# Patient Record
Sex: Female | Born: 1985 | Race: Black or African American | Hispanic: No | Marital: Single | Smoking: Current every day smoker
Health system: Southern US, Community
[De-identification: ages and names within clinical notes are randomized; demographics above are authoritative.]

## PROBLEM LIST (undated history)

## (undated) ENCOUNTER — Inpatient Hospital Stay (HOSPITAL_COMMUNITY): Payer: Self-pay

## (undated) DIAGNOSIS — R0600 Dyspnea, unspecified: Secondary | ICD-10-CM

## (undated) DIAGNOSIS — J45909 Unspecified asthma, uncomplicated: Secondary | ICD-10-CM

## (undated) DIAGNOSIS — D219 Benign neoplasm of connective and other soft tissue, unspecified: Secondary | ICD-10-CM

## (undated) DIAGNOSIS — F32A Depression, unspecified: Secondary | ICD-10-CM

## (undated) DIAGNOSIS — F329 Major depressive disorder, single episode, unspecified: Secondary | ICD-10-CM

---

## 2017-02-24 ENCOUNTER — Inpatient Hospital Stay (HOSPITAL_COMMUNITY)
Admission: AD | Admit: 2017-02-24 | Discharge: 2017-02-24 | Disposition: A | Discharge status: Home / Self Care | Payer: Self-pay | Source: Ambulatory Visit | Attending: Obstetrics & Gynecology | Admitting: Obstetrics & Gynecology

## 2017-02-24 ENCOUNTER — Encounter (HOSPITAL_COMMUNITY): Payer: Self-pay

## 2017-02-24 ENCOUNTER — Inpatient Hospital Stay (HOSPITAL_COMMUNITY): Payer: Self-pay

## 2017-02-24 DIAGNOSIS — D259 Leiomyoma of uterus, unspecified: Secondary | ICD-10-CM | POA: Insufficient documentation

## 2017-02-24 DIAGNOSIS — R109 Unspecified abdominal pain: Secondary | ICD-10-CM

## 2017-02-24 DIAGNOSIS — Z3A01 Less than 8 weeks gestation of pregnancy: Secondary | ICD-10-CM | POA: Insufficient documentation

## 2017-02-24 DIAGNOSIS — F17200 Nicotine dependence, unspecified, uncomplicated: Secondary | ICD-10-CM | POA: Insufficient documentation

## 2017-02-24 DIAGNOSIS — D251 Intramural leiomyoma of uterus: Secondary | ICD-10-CM

## 2017-02-24 DIAGNOSIS — O3411 Maternal care for benign tumor of corpus uteri, first trimester: Secondary | ICD-10-CM | POA: Insufficient documentation

## 2017-02-24 DIAGNOSIS — O26891 Other specified pregnancy related conditions, first trimester: Secondary | ICD-10-CM

## 2017-02-24 DIAGNOSIS — D252 Subserosal leiomyoma of uterus: Secondary | ICD-10-CM

## 2017-02-24 DIAGNOSIS — D250 Submucous leiomyoma of uterus: Secondary | ICD-10-CM

## 2017-02-24 HISTORY — DX: Dyspnea, unspecified: R06.00

## 2017-02-24 HISTORY — DX: Depression, unspecified: F32.A

## 2017-02-24 HISTORY — DX: Unspecified asthma, uncomplicated: J45.909

## 2017-02-24 HISTORY — DX: Major depressive disorder, single episode, unspecified: F32.9

## 2017-02-24 HISTORY — DX: Benign neoplasm of connective and other soft tissue, unspecified: D21.9

## 2017-02-24 LAB — URINALYSIS, ROUTINE W REFLEX MICROSCOPIC
BILIRUBIN URINE: NEGATIVE
Glucose, UA: NEGATIVE mg/dL
Hgb urine dipstick: NEGATIVE
KETONES UR: NEGATIVE mg/dL
Leukocytes, UA: NEGATIVE
NITRITE: NEGATIVE
Protein, ur: NEGATIVE mg/dL
Specific Gravity, Urine: 1.026 (ref 1.005–1.030)
pH: 6 (ref 5.0–8.0)

## 2017-02-24 LAB — HCG, QUANTITATIVE, PREGNANCY: HCG, BETA CHAIN, QUANT, S: 9815 m[IU]/mL — AB (ref <5)

## 2017-02-24 LAB — POCT PREGNANCY, URINE: PREG TEST UR: POSITIVE — AB

## 2017-02-24 NOTE — Progress Notes (Signed)
Spoke with Dr. Harolyn Rutherford she recommends pt have OP u/s in one week and cancel repeat quant. Spoke with pt on phone and informed her of plan for repeat u/s in one week. She is aware that she is not to come for her quant Monday.

## 2017-02-24 NOTE — MAU Note (Signed)
Patient has irregular periods, is concerned because she has been partying, positive pregnancy test last Sunday, has a fibroid, was told would have a miscarriage because of the fibroid, wants to know that everything is okay, patient does a little bit a pain on right side.

## 2017-02-24 NOTE — MAU Provider Note (Signed)
History   [id number] unsure of dates due to irreg menses, States has large fibroid that was diagnosised in [place]. States was told she could not carry a pregnancy. Admits to smoking and drinking. Pt was counseled.  CSN: [id number]  Arrival date & time 02/24/17  1400   None     Chief Complaint  Patient presents with  . Possible Pregnancy  . Fibroids    HPI  Past Medical History:  Diagnosis Date  . Asthma   . Depression   . Dyspnea   . Fibroid     History reviewed. No pertinent surgical history.  Family History  Problem Relation Age of Onset  . Arthritis Mother   . Cancer Maternal Aunt     Social History  Substance Use Topics  . Smoking status: Current Every Day Smoker  . Smokeless tobacco: Never Used  . Alcohol use Yes    OB History    Gravida Para Term Preterm AB Living   1             SAB TAB Ectopic Multiple Live Births                  Review of Systems  Constitutional: Negative.   HENT: Negative.   Eyes: Negative.   Respiratory: Negative.   Cardiovascular: Negative.   Gastrointestinal: Positive for abdominal pain.  Endocrine: Negative.   Genitourinary: Negative.   Musculoskeletal: Negative.   Skin: Negative.     Allergies  Coconut oil; Fish allergy; Peanut-containing drug products; Shellfish allergy; Lactose intolerance (gi); Wheat bran; and Soy allergy  Home Medications    BP 127/74 (BP Location: Right Arm)   Pulse 89   Temp 99.6 F (37.6 C) (Oral)   Resp 18   Ht 5\' 3"  (1.6 m)   Wt [id number] lb (72.6 kg)   LMP  (LMP Unknown)   BMI 28.34 kg/m   Physical Exam  Constitutional: She is oriented to person, place, and time. She appears well-developed and well-nourished.  HENT:  Head: Normocephalic.  Eyes: Pupils are equal, round, and reactive to light.  Neck: Normal range of motion.  Cardiovascular: Normal rate, regular rhythm, normal heart sounds and intact distal pulses.   Pulmonary/Chest: Effort normal and breath sounds normal.  Abdominal: Soft.  Bowel sounds are normal.  Genitourinary: Vagina normal and uterus normal.  Musculoskeletal: Normal range of motion.  Neurological: She is alert and oriented to person, place, and time. She has normal reflexes.  Skin: Skin is warm and dry.  Psychiatric: She has a normal mood and affect. Her behavior is normal. Judgment and thought content normal.    MAU Course  Procedures (including critical care time)  Labs Reviewed  URINALYSIS, ROUTINE W REFLEX MICROSCOPIC - Abnormal; Notable for the following:       Result Value   APPearance HAZY (*)    All other components within normal limits  HCG, QUANTITATIVE, PREGNANCY   No results found.   No diagnosis found.    MDM  VSS, Quant, will get [place] to assess pregnancy and fibroid. US show IUP at 5.5 wks and large uterine fibroid. Quant 9815. Will repeat scan in one week  Daiva Nakayama, CNM   Attestation of Attending Supervision of Advanced Practice Provider (PA/CNM/NP): Evaluation and management procedures were performed by the Advanced Practice Provider under my supervision and collaboration.  I have reviewed the Advanced Practice Provider's note and chart, and I agree with the management and plan.  Verita Schneiders, MD, [place] Attending  [place], [place]

## 2017-02-24 NOTE — Discharge Instructions (Signed)
Abdominal Pain During Pregnancy °Belly (abdominal) pain is common during pregnancy. Most of the time, it is not a serious problem. Other times, it can be a sign that something is wrong with the pregnancy. Always tell your doctor if you have belly pain. °Follow these instructions at home: °Monitor your belly pain for any changes. The following actions may help you feel better: °· Do not have sex (intercourse) or put anything in your vagina until you feel better. °· Rest until your pain stops. °· Drink clear fluids if you feel sick to your stomach (nauseous). Do not eat solid food until you feel better. °· Only take medicine as told by your doctor. °· Keep all doctor visits as told. °Get help right away if: °· You are bleeding, leaking fluid, or pieces of tissue come out of your vagina. °· You have more pain or cramping. °· You keep throwing up (vomiting). °· You have pain when you pee (urinate) or have blood in your pee. °· You have a fever. °· You do not feel your baby moving as much. °· You feel very weak or feel like passing out. °· You have trouble breathing, with or without belly pain. °· You have a very bad headache and belly pain. °· You have fluid leaking from your vagina and belly pain. °· You keep having watery poop (diarrhea). °· Your belly pain does not go away after resting, or the pain gets worse. °This information is not intended to replace advice given to you by your health care provider. Make sure you discuss any questions you have with your health care provider. °Document Released: 11/29/2009 Document Revised: 07/19/2016 Document Reviewed: 07/10/2013 °Elsevier Interactive Patient Education © 2017 [employer] ° °

## 2017-03-05 ENCOUNTER — Telehealth: Payer: Self-pay | Admitting: *Deleted

## 2017-03-05 DIAGNOSIS — Z3490 Encounter for supervision of normal pregnancy, unspecified, unspecified trimester: Secondary | ICD-10-CM

## 2017-03-06 NOTE — Telephone Encounter (Signed)
Ultrasound ordered

## 2017-03-09 ENCOUNTER — Ambulatory Visit (HOSPITAL_COMMUNITY)
Admission: RE | Admit: 2017-03-09 | Discharge: 2017-03-09 | Disposition: A | Discharge status: Home / Self Care | Payer: Self-pay | Source: Ambulatory Visit | Attending: Family Medicine | Admitting: Family Medicine

## 2017-03-09 DIAGNOSIS — D259 Leiomyoma of uterus, unspecified: Secondary | ICD-10-CM | POA: Insufficient documentation

## 2017-03-09 DIAGNOSIS — Z3490 Encounter for supervision of normal pregnancy, unspecified, unspecified trimester: Secondary | ICD-10-CM | POA: Insufficient documentation

## 2018-01-04 IMAGING — US US OB TRANSVAGINAL
1 series · 15 of 28 positions shown · non-contrast
Comparison: Pelvic ultrasound 02/24/2017

CLINICAL DATA: Pregnant patient for follow-up evaluation.

EXAM:
TRANSVAGINAL OB ULTRASOUND
TECHNIQUE: Transvaginal ultrasound was performed for complete evaluation of the
gestation as well as the maternal uterus, adnexal regions, and
pelvic cul-de-sac.

[Series 1: us ob transvaginal · 56 acquisitions, 15 frames shown]
[im 1/56]
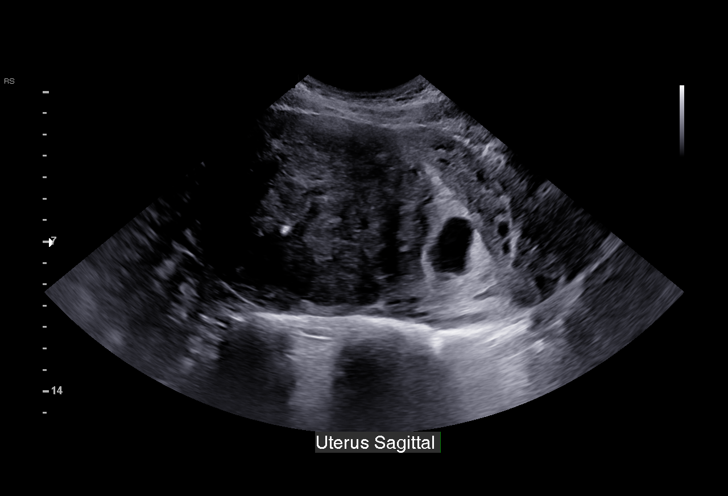
[im 5/56]
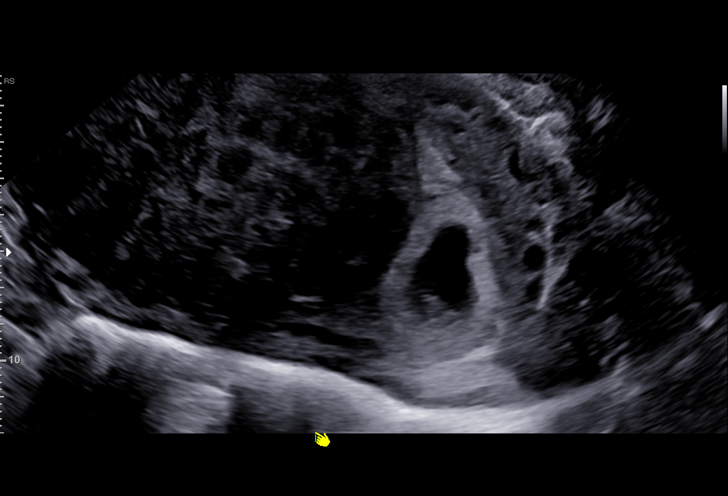
[im 9/56]
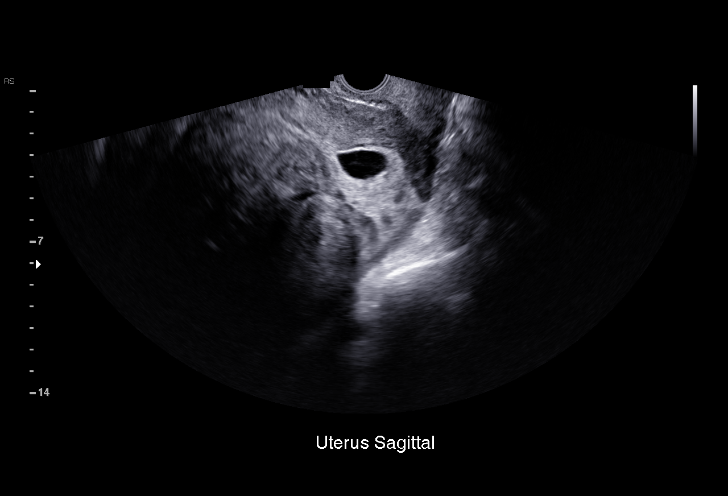
[im 13/56]
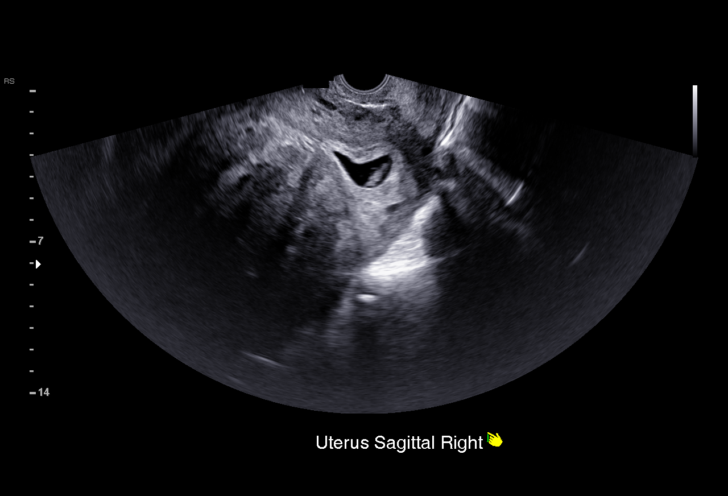
[im 17/56]
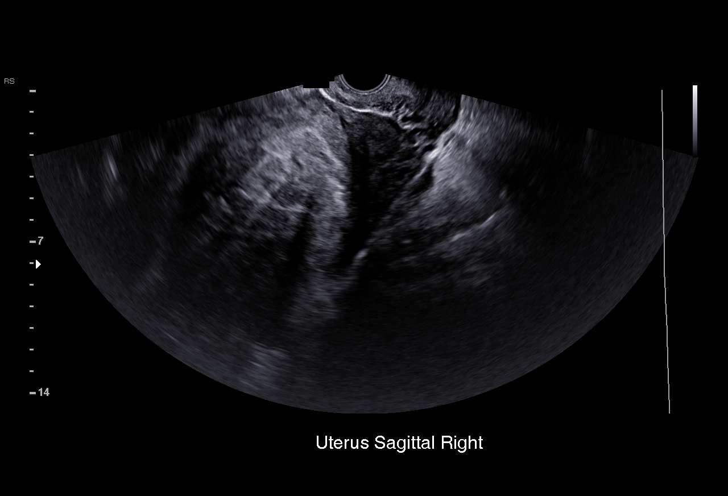
[im 21/56]
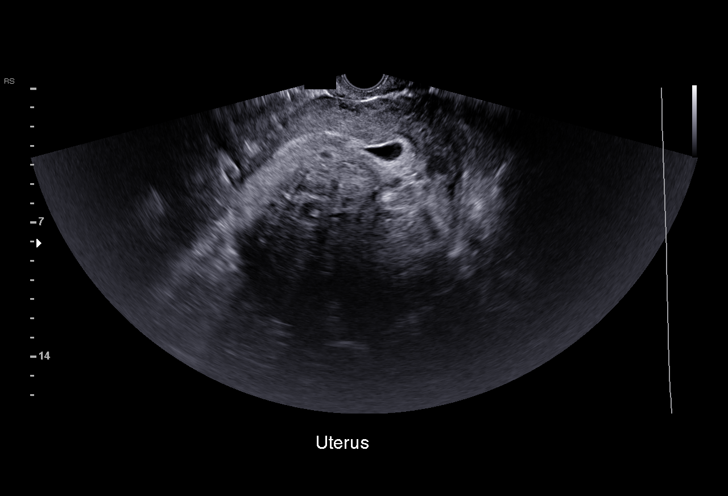
[im 25/56]
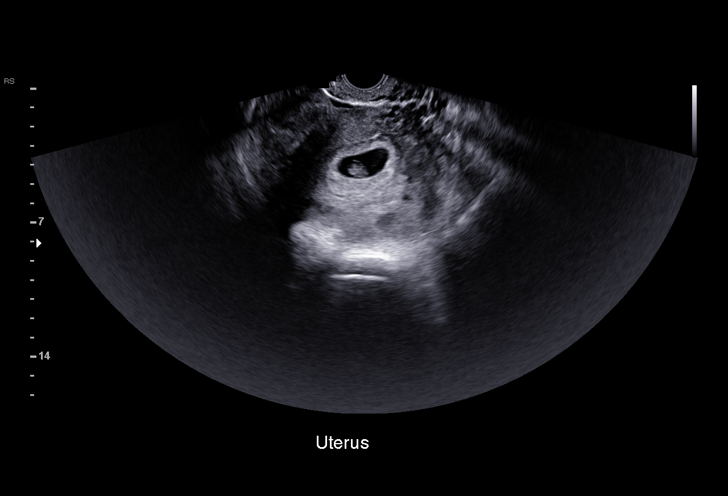
[im 29/56]
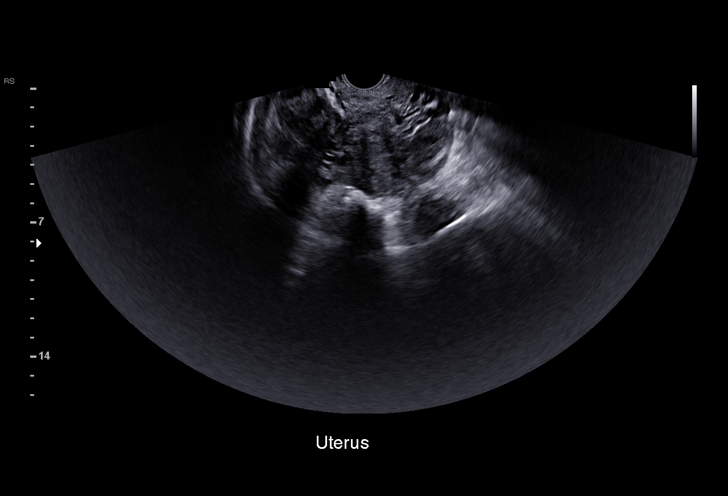
[im 31/56]
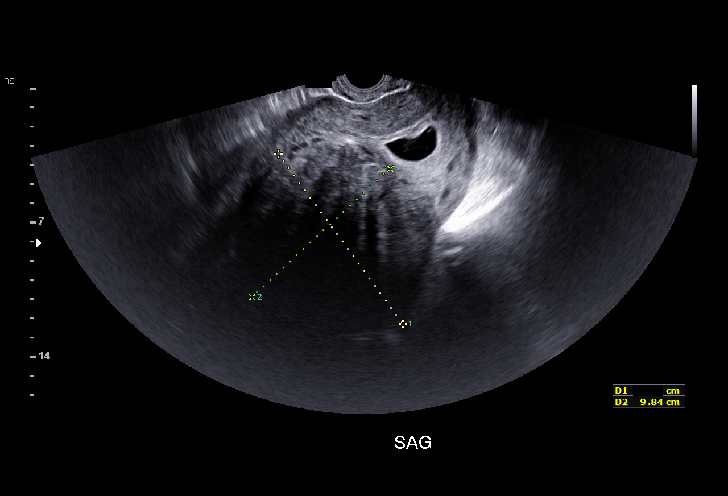
[im 35/56]
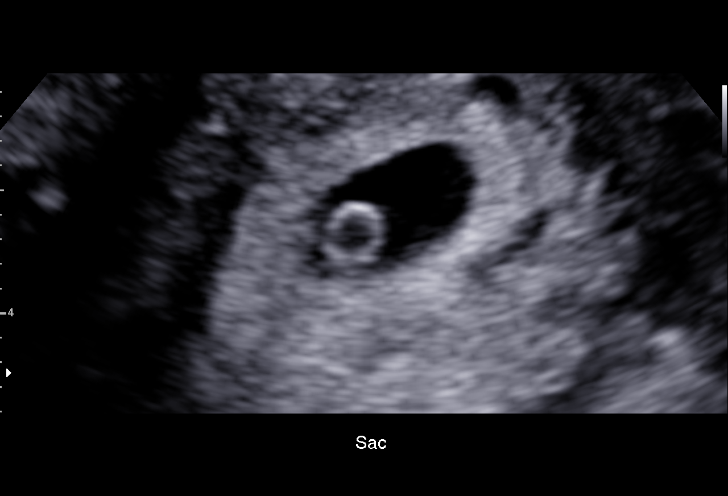
[im 39/56]
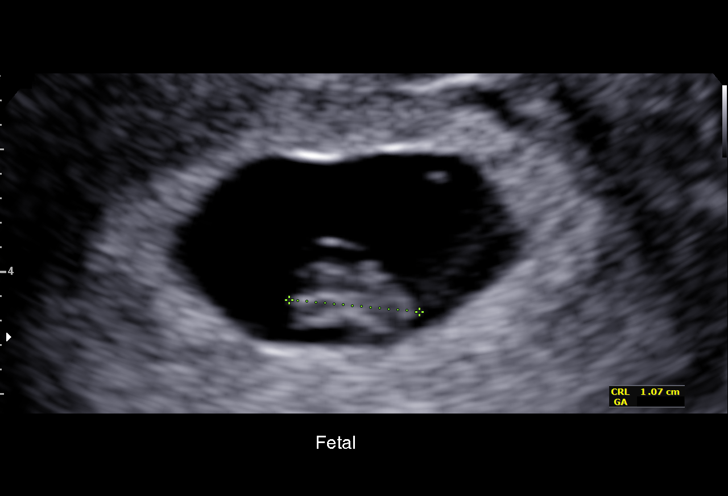
[im 43/56]
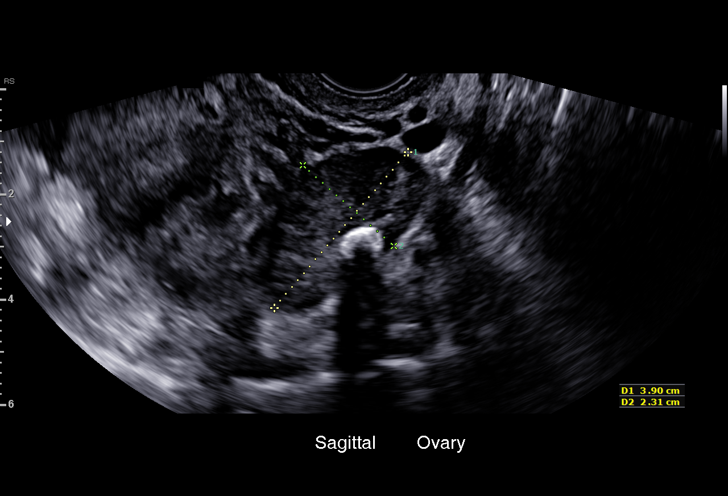
[im 47/56]
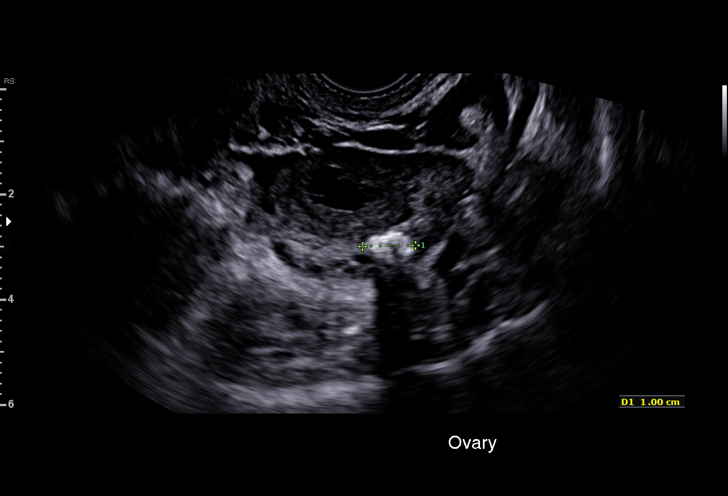
[im 51/56]
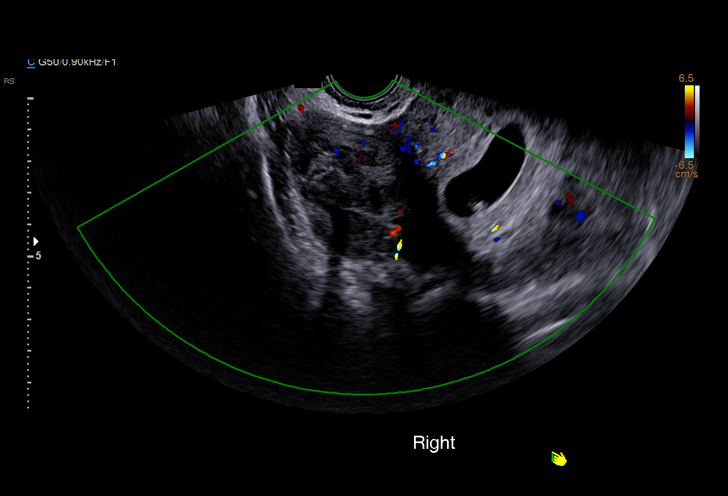
[im 56/56]
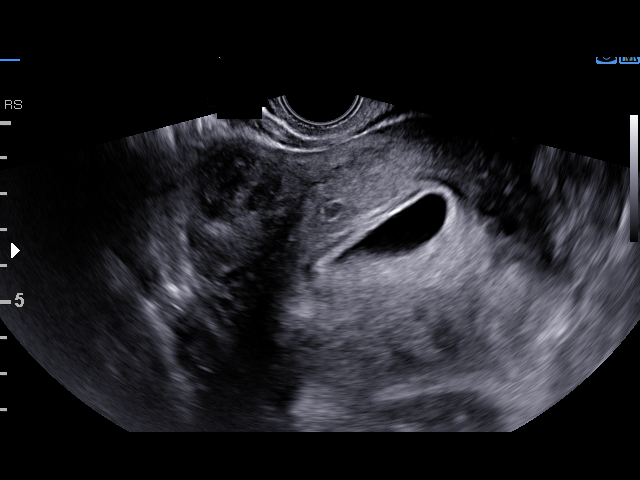

[15 of 28 positions shown; findings below may reference images not displayed]

FINDINGS: Intrauterine gestational sac: Single

Yolk sac:  Visualized.

Embryo:  Visualized.

Cardiac Activity: Visualized.

Heart Rate: 140 bpm

CRL:   10.7  mm   7 w 1 d                  US EDC: 10/25/2017

Subchorionic hemorrhage:  None visualized.

Maternal uterus/adnexae: Large fundal fibroid measuring up to 11 cm.
Re- demonstrated echogenic mass within the left ovary, potentially
representing a small dermoid. Right ovary is not well visualized.
Additionally, there is suggestion of a smaller 4 cm fibroid within
the right aspect of the uterine fundus.
IMPRESSION: Single live intrauterine gestation.  No subchorionic hemorrhage.

Fibroid uterus.

Probable small left ovarian dermoid.

## 2018-01-14 ENCOUNTER — Encounter (HOSPITAL_COMMUNITY): Payer: Self-pay

## 2022-02-08 ENCOUNTER — Emergency Department: Admission: EM | Admit: 2022-02-08 | Discharge: 2022-02-08 | Discharge status: Absent without leave | Payer: Self-pay

## 2022-10-20 ENCOUNTER — Encounter: Payer: Self-pay | Admitting: Obstetrics and Gynecology
